# Patient Record
Sex: Female | Born: 1935 | Race: White | Hispanic: No | Marital: Single | Smoking: Never smoker
Health system: Southern US, Community
[De-identification: ages and names within clinical notes are randomized; demographics above are authoritative.]

## PROBLEM LIST (undated history)

## (undated) DIAGNOSIS — I2510 Atherosclerotic heart disease of native coronary artery without angina pectoris: Secondary | ICD-10-CM

## (undated) DIAGNOSIS — K219 Gastro-esophageal reflux disease without esophagitis: Secondary | ICD-10-CM

## (undated) DIAGNOSIS — M810 Age-related osteoporosis without current pathological fracture: Secondary | ICD-10-CM

## (undated) DIAGNOSIS — I4891 Unspecified atrial fibrillation: Secondary | ICD-10-CM

## (undated) DIAGNOSIS — F0390 Unspecified dementia without behavioral disturbance: Secondary | ICD-10-CM

---

## 2018-03-27 ENCOUNTER — Emergency Department (HOSPITAL_COMMUNITY): Payer: Medicare Other

## 2018-03-27 ENCOUNTER — Encounter (HOSPITAL_COMMUNITY): Payer: Self-pay | Admitting: Emergency Medicine

## 2018-03-27 ENCOUNTER — Emergency Department (HOSPITAL_COMMUNITY)
Admission: EM | Admit: 2018-03-27 | Discharge: 2018-03-27 | Disposition: A | Discharge status: Home / Self Care | Payer: Medicare Other | Attending: Emergency Medicine | Admitting: Emergency Medicine

## 2018-03-27 DIAGNOSIS — S22080A Wedge compression fracture of T11-T12 vertebra, initial encounter for closed fracture: Secondary | ICD-10-CM | POA: Insufficient documentation

## 2018-03-27 DIAGNOSIS — S2231XA Fracture of one rib, right side, initial encounter for closed fracture: Secondary | ICD-10-CM | POA: Insufficient documentation

## 2018-03-27 DIAGNOSIS — I4891 Unspecified atrial fibrillation: Secondary | ICD-10-CM | POA: Diagnosis not present

## 2018-03-27 DIAGNOSIS — S0990XA Unspecified injury of head, initial encounter: Secondary | ICD-10-CM | POA: Diagnosis present

## 2018-03-27 DIAGNOSIS — Y939 Activity, unspecified: Secondary | ICD-10-CM | POA: Insufficient documentation

## 2018-03-27 DIAGNOSIS — W0110XA Fall on same level from slipping, tripping and stumbling with subsequent striking against unspecified object, initial encounter: Secondary | ICD-10-CM | POA: Insufficient documentation

## 2018-03-27 DIAGNOSIS — F0390 Unspecified dementia without behavioral disturbance: Secondary | ICD-10-CM | POA: Diagnosis not present

## 2018-03-27 DIAGNOSIS — Y998 Other external cause status: Secondary | ICD-10-CM | POA: Insufficient documentation

## 2018-03-27 DIAGNOSIS — Y929 Unspecified place or not applicable: Secondary | ICD-10-CM | POA: Diagnosis not present

## 2018-03-27 DIAGNOSIS — R52 Pain, unspecified: Secondary | ICD-10-CM

## 2018-03-27 HISTORY — DX: Age-related osteoporosis without current pathological fracture: M81.0

## 2018-03-27 HISTORY — DX: Unspecified atrial fibrillation: I48.91

## 2018-03-27 HISTORY — DX: Unspecified dementia, unspecified severity, without behavioral disturbance, psychotic disturbance, mood disturbance, and anxiety: F03.90

## 2018-03-27 HISTORY — DX: Gastro-esophageal reflux disease without esophagitis: K21.9

## 2018-03-27 MED ORDER — OXYCODONE-ACETAMINOPHEN 5-325 MG PO TABS: 1.0000 | ORAL_TABLET | Freq: Three times a day (TID) | ORAL | 0 refills | Status: AC | PRN

## 2018-03-27 MED ORDER — FENTANYL CITRATE (PF) 100 MCG/2ML IJ SOLN
50.0000 ug | Freq: Once | INTRAMUSCULAR | Status: AC
Start: 2018-03-27 — End: 2018-03-27
  Administered 2018-03-27: 50 ug via INTRAMUSCULAR
  Filled 2018-03-27: qty 2

## 2018-03-27 NOTE — ED Notes (Signed)
Resting quietly on stretcher with family at bedside; no c/o pain at this time; awaiting xray results; pt aware of same

## 2018-03-27 NOTE — ED Provider Notes (Signed)
[facility] EMERGENCY DEPARTMENT Provider Note   CSN: [id number] Arrival date & time: 03/27/18  4098     History   Chief Complaint Chief Complaint  Patient presents with  . Fall    HPI Pamela Crane is a 84 y.o. female 84 year old female with past medical history of A. fib, dementia, GERD who presents for evaluation after an unwitnessed mechanical fall that occurred approximately 2 AM this morning.  Patient reports that she had gone up to the bathroom and states that she tripped over something in the dark and fell.  Patient unsure of how she fell but denies any chest pain, difficulty breathing.  Patient states she did hit her head.  Patient states that since then, she has had pain to the posterior aspect of her head, right side back.  Son reports a history of A. fib and states he thinks she is on blood thinners but he cannot recall the name.  Patient denies any chest pain, dizziness, numbness/weakness, vomiting.  The history is provided by the patient.    Past Medical History:  Diagnosis Date  . A-fib (HCC)   . Dementia   . GERD (gastroesophageal reflux disease)   . Osteoporosis     There are no active problems to display for this patient.   The histories are not reviewed yet. Please review them in the "History" navigator section and refresh this SmartLink.   OB History   None      Home Medications    Prior to Admission medications   Medication Sig Start Date End Date Taking? Authorizing Provider  oxyCODONE-acetaminophen (PERCOCET/ROXICET) 5-325 MG tablet Take 1-2 tablets by mouth every 8 (eight) hours as needed for severe pain. 03/27/18   Maxwell Caul, PA-C    Family History No family history on file.  Social History Social History   Tobacco Use  . Smoking status: Not on file  Substance Use Topics  . Alcohol use: Not on file  . Drug use: Not on file     Allergies   Butalbital   Review of Systems Review of Systems    Constitutional: Negative for chills and fever.  HENT: Negative for congestion.   Eyes: Negative for visual disturbance.  Respiratory: Negative for cough and shortness of breath.   Cardiovascular: Negative for chest pain.       Right lateral chest wall pain  Gastrointestinal: Negative for abdominal pain, diarrhea, nausea and vomiting.  Genitourinary: Negative for dysuria and hematuria.  Musculoskeletal: Positive for back pain and neck pain.  Skin: Negative for rash.  Neurological: Negative for dizziness, weakness, numbness and headaches.  Psychiatric/Behavioral: Negative for confusion.     Physical Exam Updated Vital Signs BP (!) 149/99 (BP Location: Right Arm)   Pulse 84   Temp 98.6 F (37 C) (Oral)   Resp 18   SpO2 96%   Physical Exam  Constitutional: She is oriented to person, place, and time. She appears well-developed and well-nourished.  HENT:  Head: Normocephalic.  Mouth/Throat: Oropharynx is clear and moist and mucous membranes are normal.  No hematoma noted posterior aspect of head.  No laceration noted.  Bony tenderness noted to face.  No nasal deformity.  Eyes: Pupils are equal, round, and reactive to light. Conjunctivae, EOM and lids are normal.  Abnormal left pupil but patient and son state that is baseline.  EOMs intact without any difficulty.  Neck: Spinous process tenderness present.  C-collar in place.  Limited range of motion secondary to  c-collar.  Patient does exhibit some midline tenderness.  Cardiovascular: Normal rate, regular rhythm, normal heart sounds and normal pulses. Exam reveals no gallop and no friction rub.  No murmur heard. Pulmonary/Chest: Effort normal and breath sounds normal. She has no decreased breath sounds.      Tender to palpation noted to the right lateral/posterior chest wall with crepitus noted.  Lungs clear to auscultation bilaterally.  No decreased breath sounds.  Abdominal: Soft. Normal appearance. There is no tenderness.  There is no rigidity and no guarding.  Musculoskeletal: Normal range of motion.       Thoracic back: She exhibits no tenderness.       Lumbar back: She exhibits no tenderness.       Back:  Diffuse paraspinal tenderness noted to the right paraspinal muscles of the thoracic region.  Neurological: She is alert and oriented to person, place, and time.  Cranial nerves III-XII intact Follows commands, Moves all extremities  5/5 strength to BUE and BLE  Sensation intact throughout all major nerve distributions Normal finger to nose. No dysdiadochokinesia. No pronator drift. No gait abnormalities  No slurred speech. No facial droop.   Skin: Skin is warm and dry. Capillary refill takes less than 2 seconds.  Psychiatric: She has a normal mood and affect. Her speech is normal.  Nursing note and vitals reviewed.    ED Treatments / Results  Labs (all labs ordered are listed, but only abnormal results are displayed) Labs Reviewed - No data to display  EKG None  Radiology Dg Chest 2 View  Result Date: 03/27/2018 CLINICAL DATA:  Recent fall with chest pain and back pain, initial encounter EXAM: CHEST - 2 VIEW COMPARISON:  None. FINDINGS: Cardiac shadow is within normal limits. The lungs are well aerated bilaterally. No focal infiltrate or sizable effusion is seen. T12 compression deformity is noted of uncertain chronicity. No prior exam is available for comparison. Vague density is noted overlying the left apex which is not borne out on subsequent thoracic spine images. Changes of prior granulomatous disease are noted. IMPRESSION: T12 compression deformity of uncertain age. Changes of prior granulomatous disease. Mach effect over the left upper lobe as described above. Electronically Signed   By: Alcide Clever M.D.   On: 03/27/2018 10:51   Dg Ribs Unilateral Right  Result Date: 03/27/2018 CLINICAL DATA:  RECENT FALLS,PAIN RIGHT LOWER LATERAL RIB PAIN EXAM: RIGHT RIBS - 2 VIEW COMPARISON:  None.  FINDINGS: There is a nondisplaced fracture of the anterior right tenth rib. This appears acute. No other rib fractures.  No rib lesions. IMPRESSION: Nondisplaced acute fracture of the right anterior tenth rib. Electronically Signed   By: Amie Portland M.D.   On: 03/27/2018 12:35   Dg Thoracic Spine 2 View  Result Date: 03/27/2018 CLINICAL DATA:  Multiple recent falls with back pain, initial encounter EXAM: THORACIC SPINE 2 VIEWS COMPARISON:  Chest x-ray from earlier in the same day. FINDINGS: Changes of granulomatous disease are again noted. The previously seen changes in the left apex are not well appreciated on this exam and felt to be related to confluence of shadows. The visualize ribcage is within normal limits. Vertebral body height is well maintained with the exception of T12 with mild vertebral body height loss. This is of uncertain chronicity. No other focal abnormality is noted. Degenerative change of the thoracic spine is seen. IMPRESSION: T12 compression deformity of uncertain chronicity. Correlation to point tenderness is recommended. Nonemergent MRI may be helpful as clinically necessary.  Electronically Signed   By: Alcide Clever M.D.   On: 03/27/2018 10:52   Ct Head Wo Contrast  Result Date: 03/27/2018 CLINICAL DATA:  Second fall in 24 hours. History of dementia. Neck pain. EXAM: CT HEAD WITHOUT CONTRAST CT CERVICAL SPINE WITHOUT CONTRAST TECHNIQUE: Multidetector CT imaging of the head and cervical spine was performed following the standard protocol without intravenous contrast. Multiplanar CT image reconstructions of the cervical spine were also generated. COMPARISON:  None. FINDINGS: CT HEAD FINDINGS Brain: No evidence of acute infarction, hemorrhage, hydrocephalus, extra-axial collection or mass lesion/mass effect. There is sulcal enlargement reflecting age-appropriate volume loss. Patchy areas of white matter hypoattenuation are noted consistent with mild chronic microvascular ischemic  change. There is a small old lacune infarct in the right pons. Vascular: No hyperdense vessel or unexpected calcification. Skull: Normal. Negative for fracture or focal lesion. Sinuses/Orbits: Globes and orbits are unremarkable. Sinuses and mastoid air cells are clear. Other: None. CT CERVICAL SPINE FINDINGS Alignment: Slight anterolisthesis of C4-C5, degenerative in origin. No other spondylolisthesis. Skull base and vertebrae: No acute fracture. No primary bone lesion or focal pathologic process. Soft tissues and spinal canal: No prevertebral fluid or swelling. No visible canal hematoma. Disc levels: Moderate loss of disc height at C 6-C7 with minor spondylotic disc bulging and endplate spurring. There are facet degenerative changes bilaterally. No convincing disc herniation. No significant stenosis. Upper chest: No masses or adenopathy. Small poorly defined thyroid nodules. Calcified granuloma in the right upper lobe. Other: None. IMPRESSION: HEAD CT 1. No acute intracranial abnormalities. 2. 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 CERVICAL CT 1. No fracture or acute finding Electronically Signed   By: Amie Portland M.D.   On: 03/27/2018 12:48   Ct Cervical Spine Wo Contrast  Result Date: 03/27/2018 CLINICAL DATA:  Second fall in 24 hours. History of dementia. Neck pain. EXAM: CT HEAD WITHOUT CONTRAST CT CERVICAL SPINE WITHOUT CONTRAST TECHNIQUE: Multidetector CT imaging of the head and cervical spine was performed following the standard protocol without intravenous contrast. Multiplanar CT image reconstructions of the cervical spine were also generated. COMPARISON:  None. FINDINGS: CT HEAD FINDINGS Brain: No evidence of acute infarction, hemorrhage, hydrocephalus, extra-axial collection or mass lesion/mass effect. There is sulcal enlargement reflecting age-appropriate volume loss. Patchy areas of white matter hypoattenuation are noted  consistent with mild chronic microvascular ischemic change. There is a small old lacune infarct in the right pons. Vascular: No hyperdense vessel or unexpected calcification. Skull: Normal. Negative for fracture or focal lesion. Sinuses/Orbits: Globes and orbits are unremarkable. Sinuses and mastoid air cells are clear. Other: None. CT CERVICAL SPINE FINDINGS Alignment: Slight anterolisthesis of C4-C5, degenerative in origin. No other spondylolisthesis. Skull base and vertebrae: No acute fracture. No primary bone lesion or focal pathologic process. Soft tissues and spinal canal: No prevertebral fluid or swelling. No visible canal hematoma. Disc levels: Moderate loss of disc height at C 6-C7 with minor spondylotic disc bulging and endplate spurring. There are facet degenerative changes bilaterally. No convincing disc herniation. No significant stenosis. Upper chest: No masses or adenopathy. Small poorly defined thyroid nodules. Calcified granuloma in the right upper lobe. Other: None. IMPRESSION: HEAD CT 1. No acute intracranial abnormalities. 2. 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 CERVICAL CT 1. No fracture or acute finding Electronically Signed   By: Amie Portland M.D.   On: 03/27/2018 12:48    Procedures Procedures (including critical  care time)  Medications Ordered in ED Medications  fentaNYL (SUBLIMAZE) injection 50 mcg (50 mcg Intramuscular Given 03/27/18 1005)     Initial Impression / Assessment and Plan / ED Course  I have reviewed the triage vital signs and the nursing notes.  Pertinent labs & imaging results that were available during my care of the patient were reviewed by me and considered in my medical decision making (see chart for details).     84 year old female who presents for evaluation after mechanical fall that occurred approximately 2 AM this morning.  No preceding chest pain, dizziness.  Patient reports  she got up in the night and tripped and fell.  Son states he thinks she is on blood thinners but does not recall the name of it.  Patient just recently moved to [facility] from [place].  Patient reports some pain to the right lateral/posterior chest wall.  C-collar in place.  Patient does exhibit some midline tenderness.  No neuro deficits noted on exam. Patient is afebrile, non-toxic appearing, sitting comfortably on examination table. Vital signs reviewed and stable.  Lungs clear to auscultation bilaterally.  Plan for analgesics here in the department.  Will get chest x-ray, T-spine x-ray, CT head, CT C-spine for evaluation.  X-ray negative for any pneumothorax.  On my evaluation, there appears to be a small refracture noted on the right side.  I discussed with radiology and no evidence of rib fracture but of concern, recommends obtaining dedicated rib x-ray.  T-spine shows a small T12 compression fracture questionable chronicity.  CT C-spine negative for any acute abnormalities.  CT head negative for any skull fracture, intracranial hemorrhage.  Rib x-ray shows nondisplaced 10th rib fracture.  No other rib fractures noted.  Discussed results with patient and son.  Patient ambulating in the part without any difficulty.  Reports improvement in pain after analgesics.  Patient stable for discharge at this time.  Encourage primary care follow-up in the next 2 to 4 days for further evaluation. Patient had ample opportunity for questions and discussion. All patient's questions were answered with full understanding. Strict return precautions discussed. Patient expresses understanding and agreement to plan.   Final Clinical Impressions(s) / ED Diagnoses   Final diagnoses:  Closed fracture of one rib of right side, initial encounter  T12 compression fracture [facility])    ED Discharge Orders        Ordered    oxyCODONE-acetaminophen (PERCOCET/ROXICET) 5-325 MG tablet  Every 8 hours PRN     03/27/18 1256        Rosana Hoes 03/27/18 1725    Raeford Razor, MD 03/29/18 0700

## 2018-03-27 NOTE — ED Triage Notes (Addendum)
Per EMs- pt from [facility]. Pt here for eval for 2nd fall in 24 hours. Pt just arrived at [facility] 24 hours ago after a move from [place]. Pt c.o. Pain to right flank and back, also a headache. Fall was around 0200. A & O with hx of dementia. Pt in afib with hx of same, rate controlled. Not on blood thinners. 106/72 Hr 74.  Upon assessment pt has pain to right head. Pt also has pain to right ribs. Denies pain elsewhere.

## 2018-03-27 NOTE — ED Notes (Signed)
Pt and family updated on plan of care  

## 2018-03-27 NOTE — Discharge Instructions (Signed)
You can take tylenol 1000 mg three times a day for severe or breakthrough pain.  The pain medication for severe breakthrough pain. The pain medication may make you sleepy.   Make sure you are using the incentive spirometer.  [facility]-up [facility] in 2 to 4 days for further evaluation.  As we discussed, your XR showed a small T12 compression fracture. This is unclear if it is caused by your accident or if this is old.   Return to the emergency department for any worsening pain, difficulty breathing, chest pain, difficulty walking or any other worsening or concerning symptoms.

## 2018-04-05 ENCOUNTER — Other Ambulatory Visit: Payer: Self-pay

## 2018-04-05 ENCOUNTER — Encounter (HOSPITAL_COMMUNITY): Payer: Self-pay

## 2018-04-05 ENCOUNTER — Emergency Department (HOSPITAL_COMMUNITY)
Admission: EM | Admit: 2018-04-05 | Discharge: 2018-04-05 | Disposition: A | Discharge status: Other Acute Inpatient Hospital | Payer: Medicare Other | Attending: Emergency Medicine | Admitting: Emergency Medicine

## 2018-04-05 DIAGNOSIS — H16072 Perforated corneal ulcer, left eye: Secondary | ICD-10-CM

## 2018-04-05 DIAGNOSIS — H579 Unspecified disorder of eye and adnexa: Secondary | ICD-10-CM | POA: Diagnosis present

## 2018-04-05 NOTE — ED Notes (Signed)
Patient was discharged and left per own vehicle at this time.

## 2018-04-05 NOTE — Discharge Instructions (Addendum)
PLEASE GO DIRECTLY TO [facility] EMERGENCY DEPARTMENT.  Dr. Renaldo Harrison of ophthalmology will see you while you are in the department Follow directions given

## 2018-04-05 NOTE — ED Triage Notes (Addendum)
PT presents to ED from Dr. Sherryll Burger to be seen for left corneal perforation with iris incarceration. Pt denies pain in left eye. Reports blurry vision. Dr. Sherryll Burger reported pt needed to go to [facility] or [facility] ED today.

## 2018-04-05 NOTE — ED Notes (Signed)
Reports given to nurse at [facility] ED.

## 2018-04-05 NOTE — ED Notes (Signed)
Paged [facility] ophthalmology to Morrie Sheldon, [place] @ [phone]

## 2018-04-05 NOTE — ED Provider Notes (Signed)
Patient placed in Quick Look pathway, seen and evaluated   Chief Complaint: eye problem   HPI:   84 y.o. female who presents the emergency department today for eye problem.  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  He reports that patient was seen at [facility] and had a soft contact lens placed in the eye followed by glue.  Patient was seen by Dr. Sherryll Burger at [facility] today and was diagnosed with a corneal perforation of the left eye.  Patient was recommended to present to [facility] or [facility] ED for evaluation for corneal perforation.  Discussed with the patient that she will likely need a corneal tissue from the tissue bank to graft over.  Patient presented to the [facility] emergency department however.  They are requesting transfer to [facility].  Patient notes that she has blurring of vision on that side.  She denies any pain.  ROS:  Positive ROS: (+) Blurring of vision Negative ROS: (-) Fever, headache, focal weakness, numbness  Physical Exam:   Gen: No distress  Neuro: Awake and Alert. Moves all extremities grossly without ataxia.   Skin: Warm and dry  Eye: Left eye: Conjunctiva white.  No scleral erythema.  There is noted black  area along the cornea at the 3 o'clock position along the lateral aspect of the iris.   This has reported iris incarceration and Seidel positive.  Patient can grossly see  movements of my hand without difficulty.  BP 127/78 (BP Location: Right Arm)   Pulse 85   Temp 98.6 F (37 C) (Oral)   Resp 16   SpO2 100%   Plan: Patient with reported corneal perforation with a positive Seidel sign as well as reported iris incarceration at the 3 o'clock position. Reported pressure in left eye is per opthalmology notes. Deferred exam myself. Eye exam as above. Plastic eye patch remains in place. Will consult [facility] ophthalmology for further  recommendations.  Discussed case with Dr. Renaldo Harrison of [facility] ophthalmology who recommended patient by transferred ER to ER by POV. Will consult transfer line to accepting doctor at [facility] emergency department.  Patient informed to remain n.p.o.  Last oral intake at 12 PM today.  EMTALA filled out. Dr. Renaldo Harrison accepting physician. Patient will transfer via private vehicle. Directions given. Told to go directly to emergency room at [facility]. Patient is to remain NPO. Transfer line asked nursing to call [phone] to give report.   Family brought up to speed. She appears safe for transfer.   Patient case discussed with Dr. Ethelda Chick who is in agreement with plan.  Corneal perforation of left eye     Princella Pellegrini 04/05/18 2017    Doug Sou, MD 04/06/18 [id number]

## 2018-04-24 ENCOUNTER — Encounter (HOSPITAL_COMMUNITY): Payer: Self-pay | Admitting: *Deleted

## 2018-04-24 ENCOUNTER — Emergency Department (HOSPITAL_COMMUNITY): Payer: Medicare Other

## 2018-04-24 ENCOUNTER — Other Ambulatory Visit: Payer: Self-pay

## 2018-04-24 ENCOUNTER — Emergency Department (HOSPITAL_COMMUNITY)
Admission: EM | Admit: 2018-04-24 | Discharge: 2018-04-25 | Disposition: A | Discharge status: Home / Self Care | Payer: Medicare Other | Attending: Emergency Medicine | Admitting: Emergency Medicine

## 2018-04-24 DIAGNOSIS — I2510 Atherosclerotic heart disease of native coronary artery without angina pectoris: Secondary | ICD-10-CM | POA: Diagnosis not present

## 2018-04-24 DIAGNOSIS — R45851 Suicidal ideations: Secondary | ICD-10-CM | POA: Diagnosis not present

## 2018-04-24 DIAGNOSIS — F99 Mental disorder, not otherwise specified: Secondary | ICD-10-CM | POA: Diagnosis present

## 2018-04-24 DIAGNOSIS — F0390 Unspecified dementia without behavioral disturbance: Secondary | ICD-10-CM | POA: Diagnosis not present

## 2018-04-24 DIAGNOSIS — F329 Major depressive disorder, single episode, unspecified: Secondary | ICD-10-CM | POA: Insufficient documentation

## 2018-04-24 DIAGNOSIS — F32A Depression, unspecified: Secondary | ICD-10-CM

## 2018-04-24 HISTORY — DX: Atherosclerotic heart disease of native coronary artery without angina pectoris: I25.10

## 2018-04-24 LAB — COMPREHENSIVE METABOLIC PANEL
ALT: 24 U/L (ref 14–54)
AST: 42 U/L — ABNORMAL HIGH (ref 15–41)
Albumin: 2.7 g/dL — ABNORMAL LOW (ref 3.5–5.0)
Alkaline Phosphatase: 183 U/L — ABNORMAL HIGH (ref 38–126)
Anion gap: 11 (ref 5–15)
BUN: <5 mg/dL — ABNORMAL LOW (ref 6–20)
CHLORIDE: 97 mmol/L — AB (ref 101–111)
CO2: 27 mmol/L (ref 22–32)
CREATININE: 0.68 mg/dL (ref 0.44–1.00)
Calcium: 8.3 mg/dL — ABNORMAL LOW (ref 8.9–10.3)
Glucose, Bld: 80 mg/dL (ref 65–99)
Potassium: 3.3 mmol/L — ABNORMAL LOW (ref 3.5–5.1)
Sodium: 135 mmol/L (ref 135–145)
TOTAL PROTEIN: 5.6 g/dL — AB (ref 6.5–8.1)
Total Bilirubin: 1.4 mg/dL — ABNORMAL HIGH (ref 0.3–1.2)

## 2018-04-24 LAB — CBC
HCT: 41 % (ref 36.0–46.0)
Hemoglobin: 13.4 g/dL (ref 12.0–15.0)
MCH: 28.5 pg (ref 26.0–34.0)
MCHC: 32.7 g/dL (ref 30.0–36.0)
MCV: 87 fL (ref 78.0–100.0)
PLATELETS: 273 10*3/uL (ref 150–400)
RBC: 4.71 MIL/uL (ref 3.87–5.11)
RDW: 15.3 % (ref 11.5–15.5)
WBC: 11.9 10*3/uL — ABNORMAL HIGH (ref 4.0–10.5)

## 2018-04-24 LAB — SALICYLATE LEVEL

## 2018-04-24 LAB — ETHANOL

## 2018-04-24 LAB — ACETAMINOPHEN LEVEL: Acetaminophen (Tylenol), Serum: <10 ug/mL — ABNORMAL LOW (ref 10–30)

## 2018-04-24 NOTE — ED Notes (Signed)
Charge nurse notified pt undressed and placed in burgundy scrubs clothing removed no valuables with her. Sitter at bedside.  The pts son is with her  She was just brought here in [place] from [place]  She was living by nherself then

## 2018-04-24 NOTE — ED Provider Notes (Signed)
[facility] EMERGENCY DEPARTMENT Provider Note   CSN: [id number] Arrival date & time: 04/24/18  1555     History   Chief Complaint Chief Complaint  Patient presents with  . Psychiatric Evaluation    HPI Pamela Crane is a 84 y.o. female.  The history is provided by the patient and medical records.    84 year old female with history of A. fib, coronary artery disease, dementia, GERD, osteoporosis, presenting to the ED for psychiatric evaluation.  [place] from her home [place].  Was living by herself at that time but there was some issues with deconditioning, [facility].  States facility is nice, she has not had any issues with that and is well cared for.  Son reports 2 days ago she kind of "snapped" as some of her personal belongings were taking out of her room--notably vitamin C Gummies, eyedrops, etc.  These were taken out of her room as they are not prescribed and need to be monitored.  She reported to him that she felt like she was losing her independence when this happened.  Today she was found in her room with a blanket tied around her neck.  States she was trying to kill herself because she feels like she is a burden to her son and he has to take care of her all the time.  Son reports she has not been eating a lot lately, that he attributed this to dry mouth.  Patient reports she was trying to starve herself to death and that is why she has not been eating.  Son reports brief episode of depression few years ago surrounding the passing of her husband.  She did take depression medications for about a year during that time but otherwise has never had to take anything.  She has not had any falls or trauma.  Denies HI/AVH.  No EtOH or drug use.  No changes in medications.  Does have ongoing follow-up for corneal perforation of left eye-- no complaints, seems to be doing  well.  Past Medical History:  Diagnosis Date  . A-fib (HCC)   . Coronary artery disease   . Dementia   . GERD (gastroesophageal reflux disease)   . Osteoporosis     There are no active problems to display for this patient.   History reviewed. No pertinent surgical history.   OB History   None      Home Medications    Prior to Admission medications   Medication Sig Start Date End Date Taking? Authorizing Provider  oxyCODONE-acetaminophen (PERCOCET/ROXICET) 5-325 MG tablet Take 1-2 tablets by mouth every 8 (eight) hours as needed for severe pain. 03/27/18   Maxwell CaulLayden, Lindsey A, PA-C    Family History No family history on file.  Social History Social History   Tobacco Use  . Smoking status: Never Smoker  . Smokeless tobacco: Never Used  Substance Use Topics  . Alcohol use: Never    Frequency: Never  . Drug use: Never     Allergies   Butalbital   Review of Systems Review of Systems  Psychiatric/Behavioral: Positive for suicidal ideas.  All other systems reviewed and are negative.    Physical Exam Updated Vital Signs BP 126/75 (BP Location: Right Arm)   Pulse 87   Temp 98.2 F (36.8 C) (Oral)   Resp 14   Ht 5\' 2"  (1.575 m)   Wt 45.4 kg (  100 lb)   SpO2 99%   BMI 18.29 kg/m   Physical Exam  Constitutional: She is oriented to person, place, and time. She appears well-developed and well-nourished.  HENT:  Head: Normocephalic and atraumatic.  Mouth/Throat: Oropharynx is clear and moist.  Eyes: Pupils are equal, round, and reactive to light. Conjunctivae and EOM are normal.  Small patch noted to left cornea in left eye; no drainage or signs of infection  Neck: Normal range of motion.  Cardiovascular: Normal rate, regular rhythm and normal heart sounds.  Pulmonary/Chest: Effort normal and breath sounds normal. No stridor. No respiratory distress.  Abdominal: Soft. Bowel sounds are normal. There is no tenderness. There is no rebound.  Musculoskeletal:  Normal range of motion.  Neurological: She is alert and oriented to person, place, and time.  Skin: Skin is warm and dry.  Psychiatric: She has a normal mood and affect. She is not actively hallucinating. She expresses suicidal ideation. She expresses no homicidal ideation. She expresses suicidal plans. She expresses no homicidal plans.  Nursing note and vitals reviewed.    ED Treatments / Results  Labs (all labs ordered are listed, but only abnormal results are displayed) Labs Reviewed  COMPREHENSIVE METABOLIC PANEL - Abnormal; Notable for the following components:      Result Value   Potassium 3.3 (*)    Chloride 97 (*)    BUN <5 (*)    Calcium 8.3 (*)    Total Protein 5.6 (*)    Albumin 2.7 (*)    AST 42 (*)    Alkaline Phosphatase 183 (*)    Total Bilirubin 1.4 (*)    All other components within normal limits  ACETAMINOPHEN LEVEL - Abnormal; Notable for the following components:   Acetaminophen (Tylenol), Serum <10 (*)    All other components within normal limits  CBC - Abnormal; Notable for the following components:   WBC 11.9 (*)    All other components within normal limits  ETHANOL  SALICYLATE LEVEL  DIGOXIN LEVEL  RAPID URINE DRUG SCREEN, HOSP PERFORMED  URINALYSIS, ROUTINE W REFLEX MICROSCOPIC    EKG None  Radiology No results found.  Procedures Procedures (including critical care time)  Medications Ordered in ED Medications - No data to display   Initial Impression / Assessment and Plan / ED Course  I have reviewed the triage vital signs and the nursing notes.  Pertinent labs & imaging results that were available during my care of the patient were reviewed by me and considered in my medical decision making (see chart for details).  84 y.o. F here for psychiatric evaluation.  States she feels like a burden to her son who is been having to help care for her, taking her to the doctor's appointments, etc.  She reports she has been starving herself and  wrapped her blanket around her neck trying to strangle herself.  She denies any physical complaints at this time.  Labs overall reassuring.  Given her advanced age, will also obtain a urinalysis and chest x-ray to exclude any other possible physical ailments.  TTS evaluation pending.  TTS attempted to evaluate patient in the middle the night, however she was resting comfortably.  Patient's son has gone home for the night, he did wish to be present during assessment.  She will be allowed to rest here overnight and will attempt to do TTS in the morning.  Care will be signed out to the morning team, follow-up on TTS and disposition per their recommendations.  Final Clinical Impressions(s) / ED Diagnoses   Final diagnoses:  Suicidal ideation  Depression, unspecified depression type    ED Discharge Orders    None       Garlon Hatchet, PA-C 04/25/18 1610    Melene Plan, DO 04/25/18 1144

## 2018-04-24 NOTE — ED Triage Notes (Signed)
The pt arrived with her son  [facility]  [facility]  The pt had a blanket around her neck there and she has been talking about killing  Herself for the past 2 days  No previous history

## 2018-04-25 ENCOUNTER — Encounter (HOSPITAL_COMMUNITY): Payer: Self-pay | Admitting: Registered Nurse

## 2018-04-25 DIAGNOSIS — F329 Major depressive disorder, single episode, unspecified: Secondary | ICD-10-CM | POA: Diagnosis not present

## 2018-04-25 LAB — URINALYSIS, ROUTINE W REFLEX MICROSCOPIC
Bilirubin Urine: NEGATIVE
GLUCOSE, UA: NEGATIVE mg/dL
Hgb urine dipstick: NEGATIVE
KETONES UR: 20 mg/dL — AB
Nitrite: NEGATIVE
PH: 6 (ref 5.0–8.0)
Protein, ur: NEGATIVE mg/dL
Specific Gravity, Urine: 1.01 (ref 1.005–1.030)

## 2018-04-25 LAB — RAPID URINE DRUG SCREEN, HOSP PERFORMED
AMPHETAMINES: NOT DETECTED
BARBITURATES: NOT DETECTED
BENZODIAZEPINES: NOT DETECTED
COCAINE: NOT DETECTED
Opiates: NOT DETECTED
TETRAHYDROCANNABINOL: NOT DETECTED

## 2018-04-25 LAB — DIGOXIN LEVEL: Digoxin Level: 1.1 ng/mL (ref 0.8–2.0)

## 2018-04-25 NOTE — ED Notes (Signed)
Breakfast tray ordered Safety Tray Reg, diet with Thin liquids
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
## 2018-04-25 NOTE — BHH Counselor (Signed)
Per pt's nurse, pt's son has left the hospital and he wanted to be present to help his mother during the assessment because she is hard of hearing. Also, pt is sleeping soundly. Pt's nurse sts she will have TTS Consult discontinued and reordered in the morning when son si at bedside.

## 2018-04-25 NOTE — ED Notes (Signed)
Son at bedside. Sitter at bedside. Breakfast tray given.

## 2018-04-25 NOTE — BH Assessment (Signed)
Tele Assessment Note   Patient Name: Pamela Crane MRN: [id number] Referring Physician: Allyne Gee, [place] Location of Patient: [facility] Location of Provider: Behavioral Health TTS Department  SUHAYLA CHISOM is an 84 y.o. female. Pt states that she tied a shirt around her neck in an attempt to harm herself. Per Pt she was worried that her son was overwhelmed with taking care of her. Pt currently denies SI/HI and AVH. Pt states that she spoke with her son and understands that he is not burdened by her. The Pt states she does not want to harm herself. Pt denies previous SI attempt. Pt denies previous inpatient treatment.  Pt's son Pamela Crane was present during the assessment. Pt's son states that he feels that his mother is safe to return back to her ALF. Pamela Crane states that he has constant contact with his mother.  Shuvon, NP was present during the assessment. Shuvon, NP recommends Crane/C to ALF. Recommends outpatient treatment resources.   Diagnosis:  F32.9 Unspecified depression  Past Medical History:  Past Medical History:  Diagnosis Date  . A-fib (HCC)   . Coronary artery disease   . Dementia   . GERD (gastroesophageal reflux disease)   . Osteoporosis     History reviewed. No pertinent surgical history.  Family History: History reviewed. No pertinent family history.  Social History:  reports that she has never smoked. She has never used smokeless tobacco. She reports that she does not drink alcohol or use drugs.  Additional Social History:  Alcohol / Drug Use Pain Medications: please see mar Prescriptions: please see mar Over the Counter: please see mar History of alcohol / drug use?: No history of alcohol / drug abuse Longest period of sobriety (when/how long): NA  CIWA: CIWA-Ar BP: 126/75 Pulse Rate: 87 COWS:    Allergies:  Allergies  Allergen Reactions  . Butalbital Other (See Comments)    Reaction not recalled by the patient's son  . Other Other (See Comments)    Unnamed  steroids (received through the eyes)- Was "overdosed" on these and rendered the patient unable to walk or function    Home Medications:  (Not in a hospital admission)  OB/GYN Status:  No LMP recorded. Patient is postmenopausal.  General Assessment Data Assessment unable to be completed: Yes Reason for not completing assessment: Pt's son has left and wants to be present to help his mom during the assessment. Pt sleeping.  [facility] of Assessment: [facility] ED TTS Assessment: In system Is this a Tele or Face-to-Face Assessment?: Tele Assessment Is this an Initial Assessment or a Re-assessment for this encounter?: Initial Assessment Marital status: Widowed [place] name: NA Is patient pregnant?: No Pregnancy Status: No Living Arrangements: Other (Comment)(ALF) Can pt return to current living arrangement?: Yes Admission Status: Voluntary Is patient capable of signing voluntary admission?: Yes Referral Source: Self/Family/Friend Insurance type: Medicare     Crisis Care Plan Living Arrangements: Other (Comment)(ALF) Legal Guardian: Other:(self) Name of Psychiatrist: NA Name of Therapist: NA  Education Status Is patient currently in school?: No Is the patient employed, unemployed or receiving disability?: Unemployed  Risk to self with the past 6 months Suicidal Ideation: No-Not Currently/Within Last 6 Months Has patient been a risk to self within the past 6 months prior to admission? : No Suicidal Intent: No-Not Currently/Within Last 6 Months Has patient had any suicidal intent within the past 6 months prior to admission? : No Is patient at risk for suicide?: No Suicidal Plan?: No Has patient had any suicidal plan  within the past 6 months prior to admission? : No Access to Means: No What has been your use of drugs/alcohol within the last 12 months?: NA Previous Attempts/Gestures: No How many times?: 0 Other Self Harm Risks: NA Triggers for Past Attempts: None known Intentional Self  Injurious Behavior: None Family Suicide History: No Recent stressful life event(s): Other (Comment)(worried about son) Persecutory voices/beliefs?: No Depression: No Depression Symptoms: (pt currently denies) Substance abuse history and/or treatment for substance abuse?: No Suicide prevention information given to non-admitted patients: Not applicable  Risk to Others within the past 6 months Homicidal Ideation: No Does patient have any lifetime risk of violence toward others beyond the six months prior to admission? : No Thoughts of Harm to Others: No Current Homicidal Intent: No Current Homicidal Plan: No Access to Homicidal Means: No Identified Victim: NA History of harm to others?: No Assessment of Violence: None Noted Violent Behavior Description: NA Does patient have access to weapons?: No Criminal Charges Pending?: No Does patient have a court date: No Is patient on probation?: No  Psychosis Hallucinations: None noted Delusions: None noted  Mental Status Report Appearance/Hygiene: Unremarkable Eye Contact: Fair Motor Activity: Freedom of movement Speech: Logical/coherent Level of Consciousness: Alert Mood: Euthymic Affect: Appropriate to circumstance Anxiety Level: Minimal Thought Processes: Coherent, Relevant Judgement: Unimpaired Orientation: Person, Place, Time, Situation Obsessive Compulsive Thoughts/Behaviors: None  Cognitive Functioning Concentration: Normal Memory: Recent Intact, Remote Intact Is patient IDD: No Is patient DD?: No Insight: Fair Impulse Control: Fair Appetite: Fair Have you had any weight changes? : No Change Sleep: No Change Total Hours of Sleep: 8 Vegetative Symptoms: None  ADLScreening [facility] Assessment Services) Patient's cognitive ability adequate to safely complete daily activities?: Yes Patient able to express need for assistance with ADLs?: Yes Independently performs ADLs?: No  Prior Inpatient Therapy Prior Inpatient  Therapy: No  Prior Outpatient Therapy Prior Outpatient Therapy: No Does patient have an ACCT team?: No Does patient have Intensive In-House Services?  : No Does patient have [facility] services? : No Does patient have P4CC services?: No  ADL Screening (condition at time of admission) Patient's cognitive ability adequate to safely complete daily activities?: Yes Is the patient deaf or have difficulty hearing?: Yes Does the patient have difficulty seeing, even when wearing glasses/contacts?: No Does the patient have difficulty concentrating, remembering, or making decisions?: Yes Patient able to express need for assistance with ADLs?: Yes Does the patient have difficulty dressing or bathing?: Yes Independently performs ADLs?: No Communication: Needs assistance Is this a change from baseline?: Change from baseline, expected to last >3 days Dressing (OT): Needs assistance Grooming: Needs assistance Feeding: Needs assistance Bathing: Needs assistance Toileting: Needs assistance In/Out Bed: Needs assistance Walks in Home: Needs assistance       Abuse/Neglect Assessment (Assessment to be complete while patient is alone) Abuse/Neglect Assessment Can Be Completed: Yes Physical Abuse: Denies Verbal Abuse: Denies Sexual Abuse: Denies Exploitation of patient/patient's resources: Denies     Merchant navy officer (For Healthcare) Does Patient Have a Medical Advance Directive?: No Would patient like information on creating a medical advance directive?: No - Patient declined    Additional Information 1:1 In Past 12 Months?: No CIRT Risk: No Elopement Risk: No Does patient have medical clearance?: Yes     Disposition:  Disposition Initial Assessment Completed for this Encounter: Yes Disposition of Patient: Discharge Patient refused recommended treatment: No  This service was provided via telemedicine using a 2-way, interactive audio and video technology.  Names of all persons  participating in this telemedicine service and their role in this encounter. Name: Sena SlateDoug Crane Role: Son  Name:  Role:   Name:  Role:   Name:  Role:     Emmit PomfretLevette,Pamela Crane 04/25/2018 9:56 AM

## 2018-04-25 NOTE — ED Notes (Signed)
Pt resting Bed rails x2 up

## 2018-04-25 NOTE — Progress Notes (Signed)
Pt. Reassessed by TTS in consultation with Physician Extender, Assunta FoundShuvon Rankin, NP. Pt does not meet criteria for inpatient treatment. Disposition CSW contacted pt's ED nurse, Ivory BroadKelly M, RN to notify. Note from TTS and Physician Extender pending.  Timmothy EulerJean T. Kaylyn LimSutter, MSW, LCSWA Disposition Clinical Social Work [phone] (cell) [phone] (office)

## 2018-04-25 NOTE — ED Notes (Signed)
TTS at bedside. Son at bedside.

## 2018-04-25 NOTE — ED Notes (Signed)
TTS called and will do TTS in the morning due to the pts age and her being asleep currently. Please make sure another TTS order is placed at 0700.

## 2018-04-25 NOTE — ED Notes (Signed)
I called the pts son, Pamela Crane, and left VM for him that TTS would be done tomorrow morning, it could be as early as 0730.

## 2018-04-25 NOTE — ED Notes (Signed)
pts hearing aids at bedside in specimen cup with pt sticker labeled.

## 2018-04-25 NOTE — ED Notes (Signed)
Pt son, Gala RomneyDoug, has left. He is taking the pts purse with him. Pt aware
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
## 2018-04-25 NOTE — ED Provider Notes (Signed)
Patient seen by psychiatry service and I have spoken with him as well and they are in now cleared for discharge.  Son is comfortable with this and will take patient home   Lorre NickAllen, Cane Dubray, MD 04/25/18 [phone]

## 2018-07-21 DEATH — deceased

## 2018-10-20 IMAGING — CT CT CERVICAL SPINE W/O CM
4 of 6 series · 14 of 33 positions shown, 15 images · non-contrast
Comparison: None.

CLINICAL DATA: Second fall in 24 hours. History of dementia. Neck
pain.

EXAM:
CT HEAD WITHOUT CONTRAST
CT CERVICAL SPINE WITHOUT CONTRAST
TECHNIQUE: Multidetector CT imaging of the head and cervical spine was
performed following the standard protocol without intravenous
contrast. Multiplanar CT image reconstructions of the cervical spine
were also generated.

[Series 5: head bone · axial · 0.44mm/px · z∈[-136,-104]mm · 2 of 83 slices shown]
[im 17/83  bone]
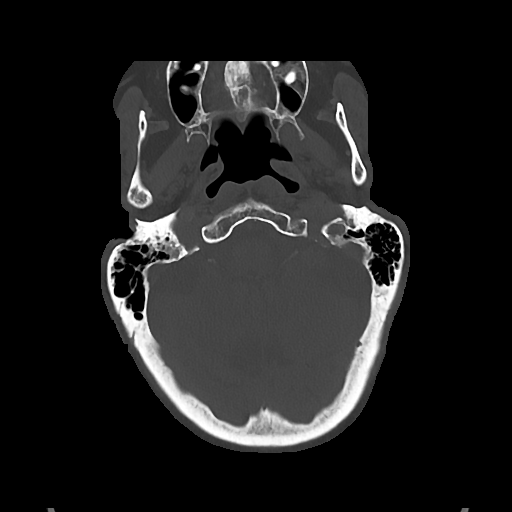
[im 33/83  bone]
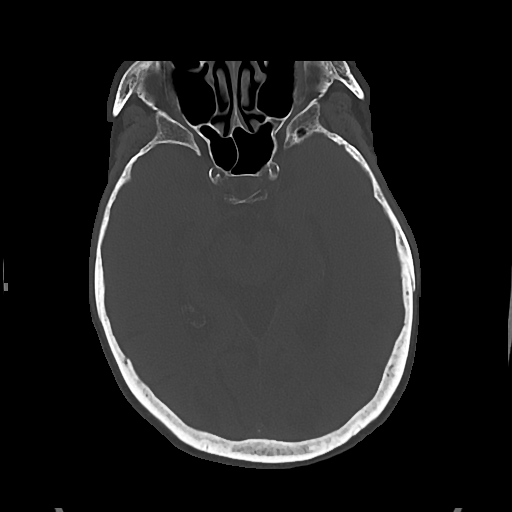

[Series 6: cor soft · coronal · 0.32mm/px · 3 of 75 slices shown]
[im 15/75  bone]
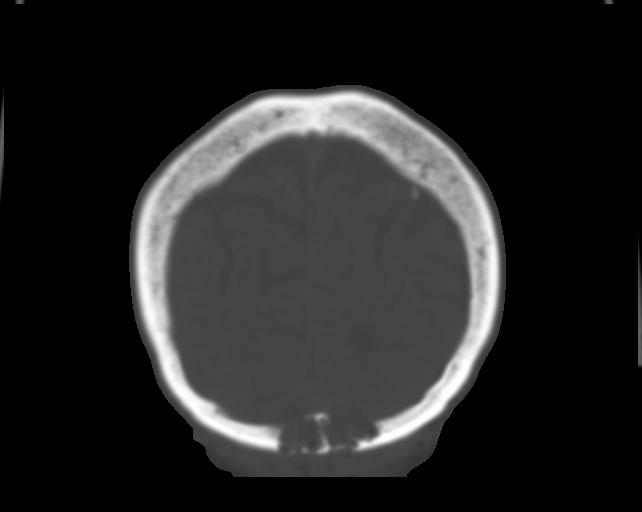
[im 30/75  bone]
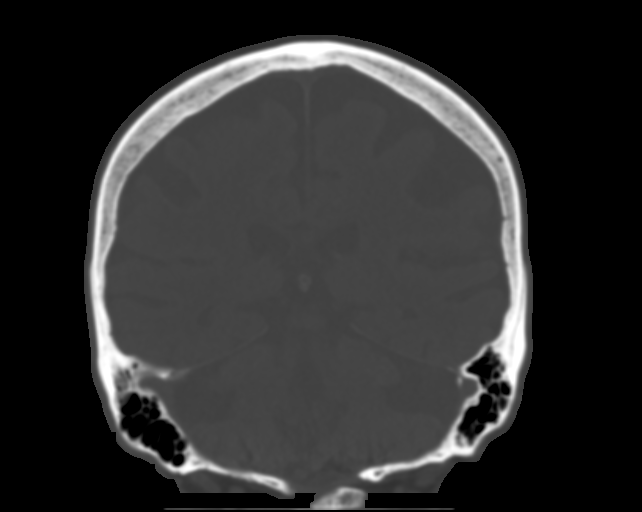
[im 45/75  bone]
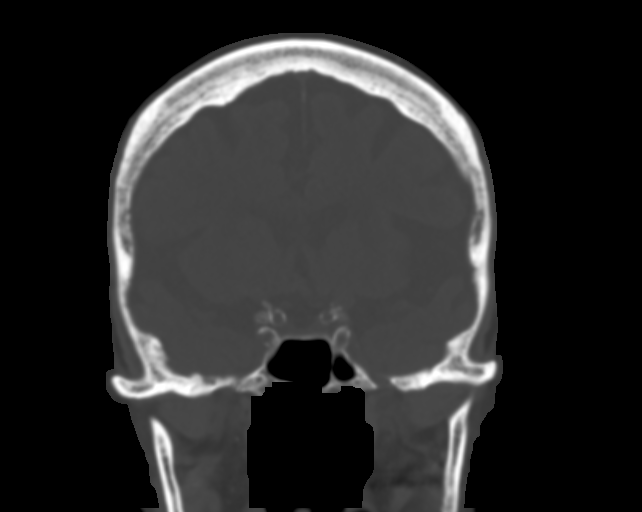

[Series 8: c spine soft · axial · 0.37mm/px · z∈[-239,-139]mm · 4 of 84 slices shown, 5 images]
[im 17/84  soft-tissue]
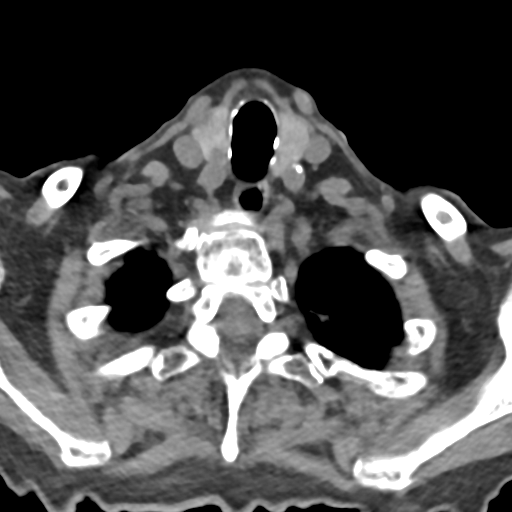
[im 17/84  bone]
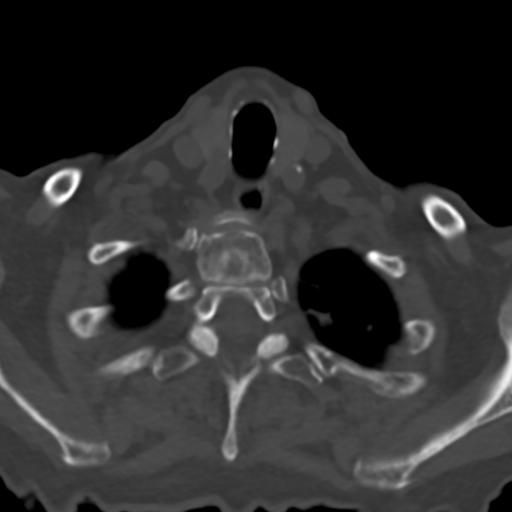
[im 34/84  bone]
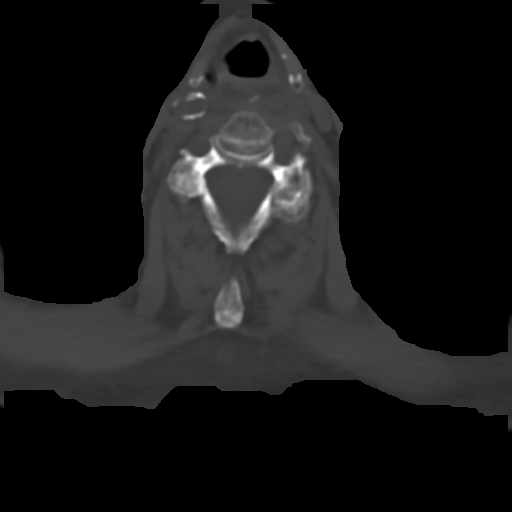
[im 50/84  bone]
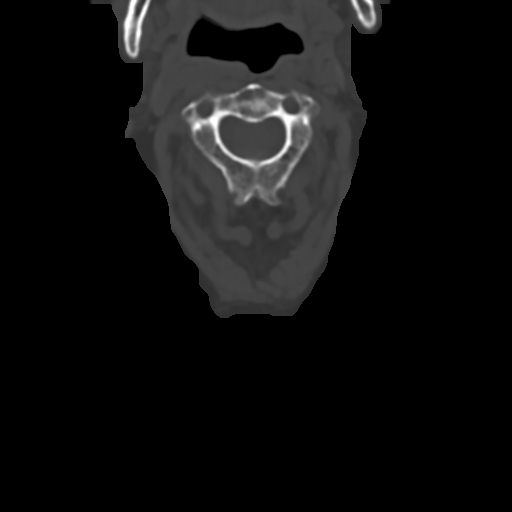
[im 67/84  bone]
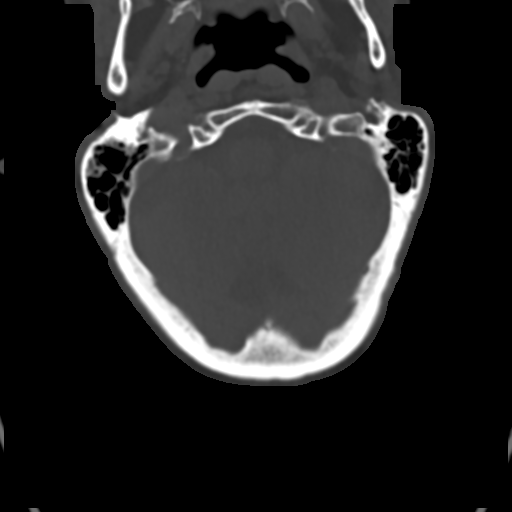

[Series 10: sag bone · sagittal · 0.24mm/px · 5 of 61 slices shown]
[im 11/61  bone]
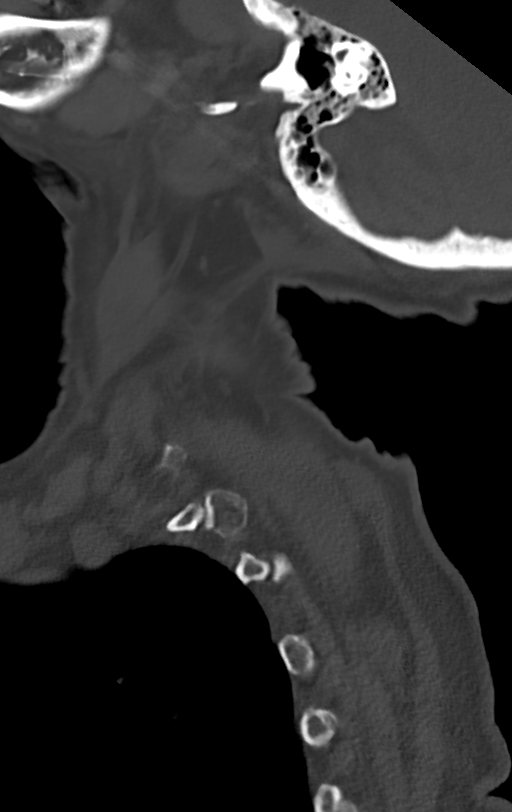
[im 21/61  bone]
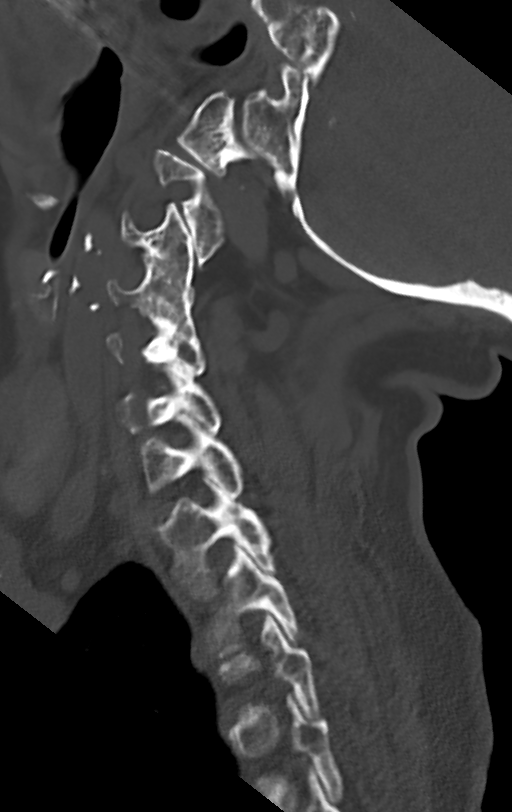
[im 31/61  bone]
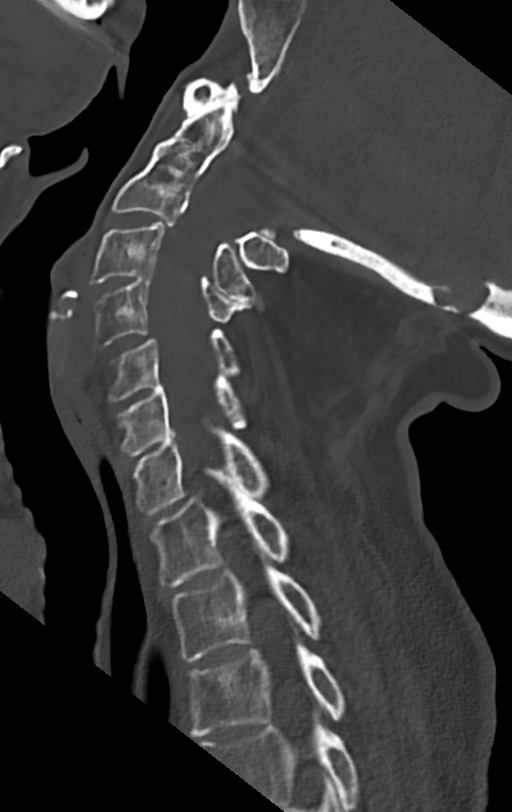
[im 41/61  bone]
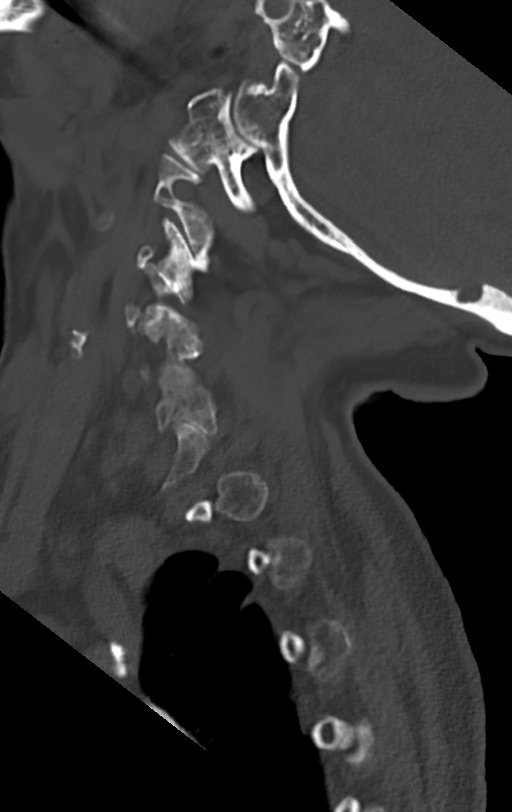
[im 51/61  bone]
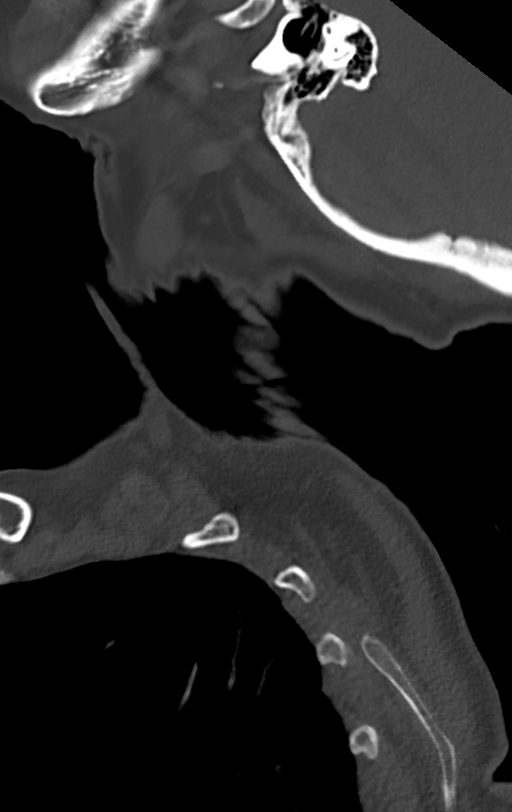

[14 of 33 positions shown; findings below may reference images not displayed]

FINDINGS: CT HEAD FINDINGS

Brain: No evidence of acute infarction, hemorrhage, hydrocephalus,
extra-axial collection or mass lesion/mass effect.

There is sulcal enlargement reflecting age-appropriate volume loss.
Patchy areas of white matter hypoattenuation are noted consistent
with mild chronic microvascular ischemic change. There is a small
old lacune infarct in the right pons.

Vascular: No hyperdense vessel or unexpected calcification.

Skull: Normal. Negative for fracture or focal lesion.

Sinuses/Orbits: Globes and orbits are unremarkable. Sinuses and
mastoid air cells are clear.

Other: None.

CT CERVICAL SPINE FINDINGS

Alignment: Slight anterolisthesis of C4-C5, degenerative in origin.
No other spondylolisthesis.

Skull base and vertebrae: No acute fracture. No primary bone lesion
or focal pathologic process.

Soft tissues and spinal canal: No prevertebral fluid or swelling. No
visible canal hematoma.

Disc levels: Moderate loss of disc height at C 6-C7 with minor
spondylotic disc bulging and endplate spurring. There are facet
degenerative changes bilaterally. No convincing disc herniation. No
significant stenosis.

Upper chest: No masses or adenopathy. Small poorly defined thyroid
nodules. Calcified granuloma in the right upper lobe.

Other: None.
IMPRESSION: HEAD CT

1. No acute intracranial abnormalities.
2. Age-appropriate volume loss, chronic microvascular ischemic
change and small old right pontine lacune infarct.

CERVICAL CT

1. No fracture or acute finding

## 2018-11-17 IMAGING — DX DG CHEST 1V PORT
1 series · 1 of 1 positions shown · non-contrast
Comparison: Chest and right rib radiographs 03/27/2018

CLINICAL DATA: [AGE] for psychiatric clearance.

EXAM:
PORTABLE CHEST 1 VIEW

[chest ap]
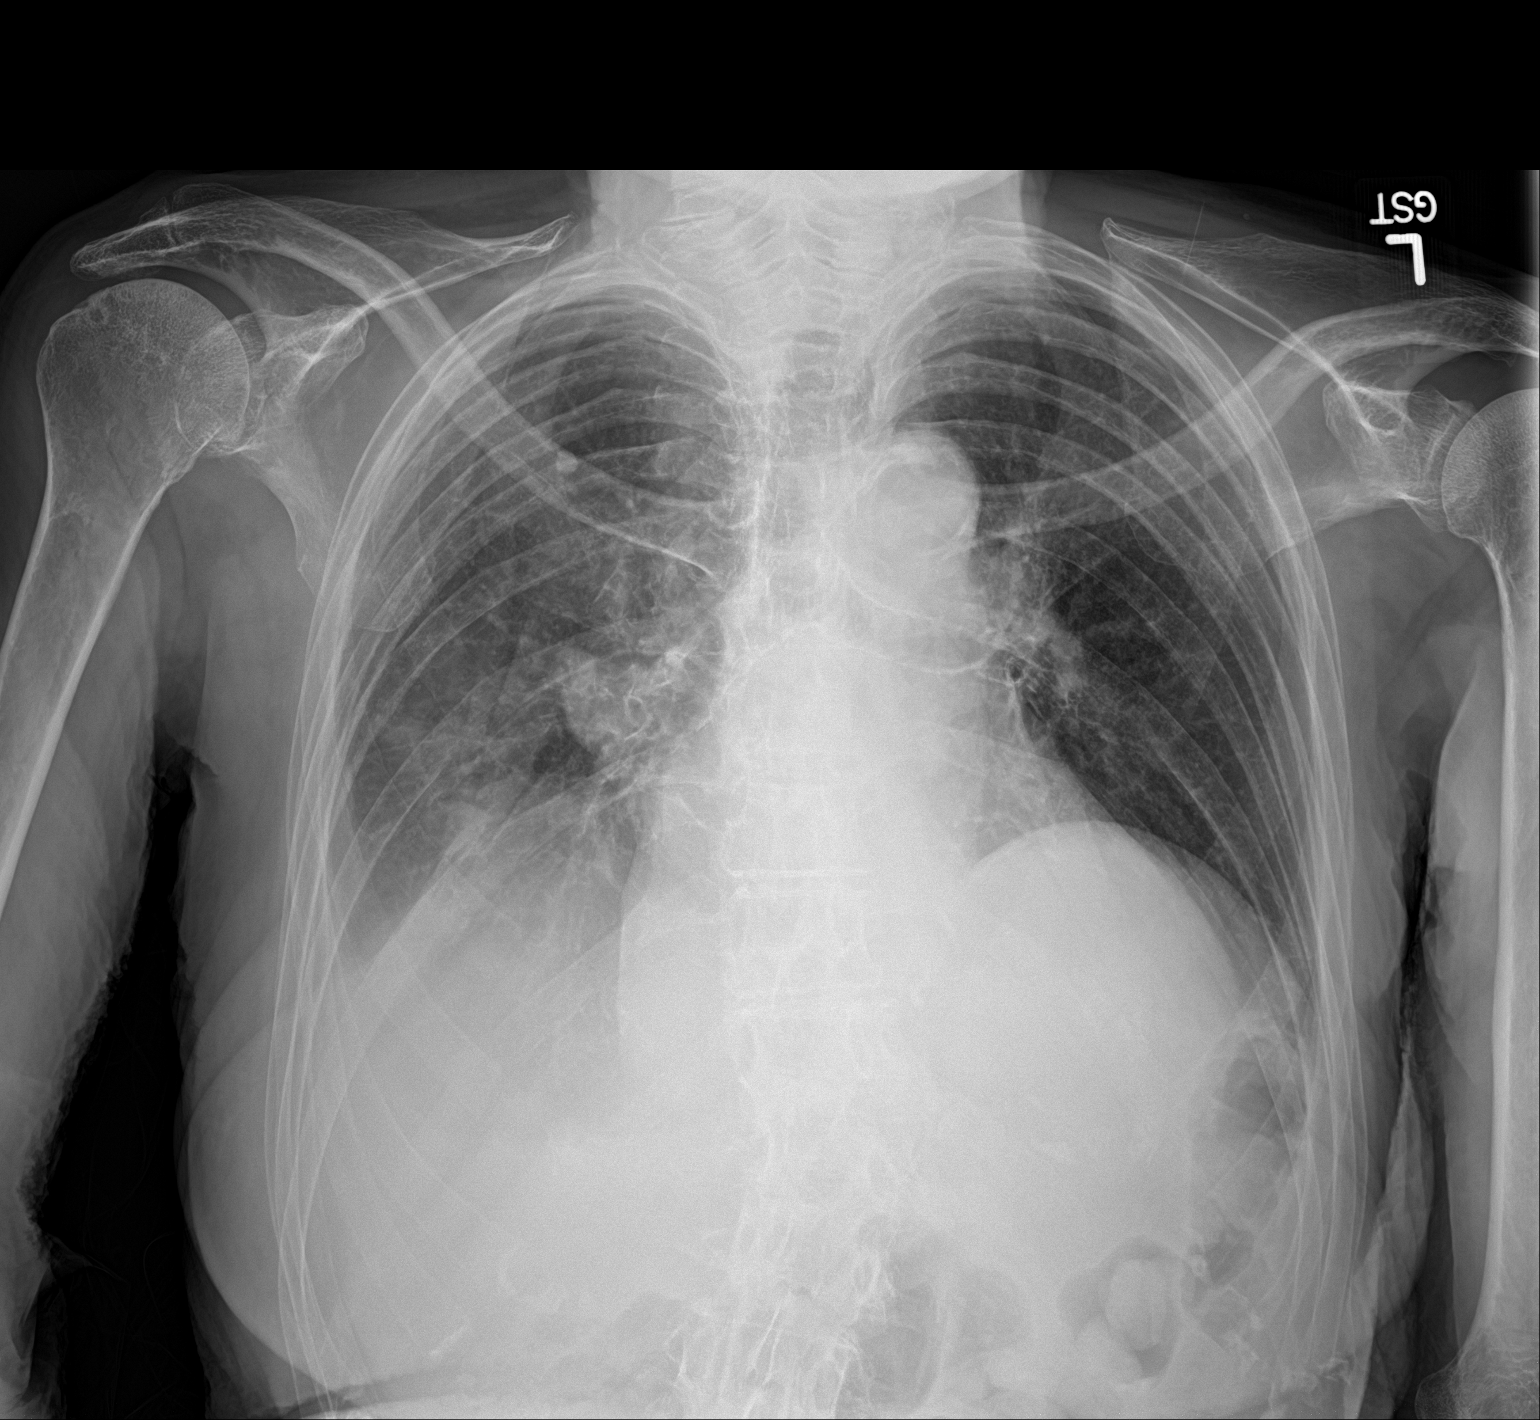

[1 of 1 positions shown; findings below may reference images not displayed]

FINDINGS: Low lung volumes. Unchanged cardiomegaly. Hazy opacity at the right
lung base may be combination of pleural fluid and atelectasis. No
pulmonary edema. Calcified granuloma in the right upper lung versus
bone island. Right rib fracture on prior rib series not well seen on
current AP exam.
IMPRESSION: Low lung volumes. Hazy right lung base opacity likely combination of
pleural fluid and atelectasis, given recent right rib fracture, may
be posttraumatic. Pneumonia is also considered.
